# Patient Record
Sex: Male | Born: 1960 | Hispanic: No | Marital: Married | Smoking: Current every day smoker
Health system: Southern US, Community
[De-identification: ages and names within clinical notes are randomized; demographics above are authoritative.]

## PROBLEM LIST (undated history)

## (undated) DIAGNOSIS — M1990 Unspecified osteoarthritis, unspecified site: Secondary | ICD-10-CM

## (undated) DIAGNOSIS — E119 Type 2 diabetes mellitus without complications: Secondary | ICD-10-CM

## (undated) HISTORY — PX: TONSILLECTOMY: SUR1361

## (undated) HISTORY — PX: BACK SURGERY: SHX140

---

## 2005-02-18 ENCOUNTER — Ambulatory Visit (HOSPITAL_COMMUNITY): Admission: RE | Admit: 2005-02-18 | Discharge: 2005-02-19 | Payer: Self-pay | Admitting: Specialist

## 2019-07-06 ENCOUNTER — Encounter: Payer: Self-pay | Admitting: Internal Medicine

## 2021-01-23 ENCOUNTER — Ambulatory Visit: Payer: Self-pay | Admitting: Orthopedic Surgery

## 2021-01-27 NOTE — Pre-Procedure Instructions (Signed)
Surgical Instructions    Your procedure is scheduled on Thursday, January 30, 2021 from 10:15 AM - 12:15 PM.  Report to [facility] Main Entrance "A" at 8:15 A.M., then check in with the Admitting office.  Call this number if you have problems the morning of surgery:  [phone]   If you have any questions prior to your surgery date call [phone]: Open Monday-Friday 8am-4pm    Remember:  Do not eat after midnight the night before your surgery  You may drink clear liquids until 7:15 AM the morning of your surgery.   Clear liquids allowed are: Water, Non-Citrus Juices (without pulp), Carbonated Beverages, Clear Tea, Black Coffee Only, and Gatorade   Please complete your PRE-SURGERY ENSURE that was provided to you by 7:15 AM the morning of surgery.  Please, if able, drink it in one setting. DO NOT SIP.     Take these medicines the morning of surgery with A SIP OF WATER:   gabapentin (NEURONTIN) omeprazole (PRILOSEC)   AS NEEDED:  HYDROcodone-acetaminophen (NORCO)   Please bring albuterol (VENTOLIN HFA) with you the day of surgery.   As of today, STOP taking any Aspirin (unless otherwise instructed by your surgeon) Aleve, Naproxen, Ibuprofen, Motrin, Advil, Goody's, BC's, all herbal medications, fish oil, and all vitamins.  WHAT DO I DO ABOUT MY DIABETES MEDICATION?  Do not take your evening dose of glipiZIDE (GLUCOTROL) on 01/29/2021.  Do not take (metFORMIN (GLUCOPHAGE) or glipiZIDE (GLUCOTROL)) the morning of surgery.  The day of surgery, do not take Trulicity (dulaglutide).   HOW TO MANAGE YOUR DIABETES BEFORE AND AFTER SURGERY  Why is it important to control my blood sugar before and after surgery? Improving blood sugar levels before and after surgery helps healing and can limit problems. A way of improving blood sugar control is eating a healthy diet by:  Eating less sugar and carbohydrates  Increasing activity/exercise  Talking with your doctor about  reaching your blood sugar goals High blood sugars (greater than 180 mg/dL) can raise your risk of infections and slow your recovery, so you will need to focus on controlling your diabetes during the weeks before surgery. Make sure that the doctor who takes care of your diabetes knows about your planned surgery including the date and location.  How do I manage my blood sugar before surgery? Check your blood sugar at least 4 times a day, starting 2 days before surgery, to make sure that the level is not too high or low.  Check your blood sugar the morning of your surgery when you wake up and every 2 hours until you get to the Short Stay unit.  If your blood sugar is less than 70 mg/dL, you will need to treat for low blood sugar: Do not take insulin. Treat a low blood sugar (less than 70 mg/dL) with  cup of clear juice (cranberry or apple), 4 glucose tablets, OR glucose gel. Recheck blood sugar in 15 minutes after treatment (to make sure it is greater than 70 mg/dL). If your blood sugar is not greater than 70 mg/dL on recheck, call [phone] for further instructions. Report your blood sugar to the short stay nurse when you get to Short Stay.  If you are admitted to the hospital after surgery: Your blood sugar will be checked by the staff and you will probably be given insulin after surgery (instead of oral diabetes medicines) to make sure you have good blood sugar levels. The goal for blood sugar control after surgery  is 80-180 mg/dL.             Do NOT Smoke (Tobacco/Vaping) or drink Alcohol 24 hours prior to your procedure.  If you use a CPAP at night, you may bring all equipment for your overnight stay.   Contacts, glasses, piercing's, hearing aid's, dentures or partials may not be worn into surgery, please bring cases for these belongings.    For patients admitted to the hospital, discharge time will be determined by your treatment team.   Patients discharged the day of surgery will  not be allowed to drive home, and someone needs to stay with them for 24 hours.  ONLY 1 SUPPORT PERSON MAY BE PRESENT WHILE YOU ARE IN SURGERY. IF YOU ARE TO BE ADMITTED ONCE YOU ARE IN YOUR ROOM YOU WILL BE ALLOWED TWO (2) VISITORS.  Minor children may have two parents present. Special consideration for safety and communication needs will be reviewed on a case by case basis.   Special instructions:   [facility]- Preparing For Surgery  Before surgery, you can play an important role. Because skin is not sterile, your skin needs to be as free of germs as possible. You can reduce the number of germs on your skin by washing with CHG (chlorahexidine gluconate) Soap before surgery.  CHG is an antiseptic cleaner which kills germs and bonds with the skin to continue killing germs even after washing.    Oral Hygiene is also important to reduce your risk of infection.  Remember - BRUSH YOUR TEETH THE MORNING OF SURGERY WITH YOUR REGULAR TOOTHPASTE  Please do not use if you have an allergy to CHG or antibacterial soaps. If your skin becomes reddened/irritated stop using the CHG.  Do not shave (including legs and underarms) for at least 48 hours prior to first CHG shower. It is OK to shave your face.  Please follow these instructions carefully.   Shower the NIGHT BEFORE SURGERY and the MORNING OF SURGERY  If you chose to wash your hair, wash your hair first as usual with your normal shampoo.  After you shampoo, rinse your hair and body thoroughly to remove the shampoo.  Use CHG Soap as you would any other liquid soap. You can apply CHG directly to the skin and wash gently with a scrungie or a clean washcloth.   Apply the CHG Soap to your body ONLY FROM THE NECK DOWN.  Do not use on open wounds or open sores. Avoid contact with your eyes, ears, mouth and genitals (private parts). Wash Face and genitals (private parts)  with your normal soap.   Wash thoroughly, paying special attention to the area  where your surgery will be performed.  Thoroughly rinse your body with warm water from the neck down.  DO NOT shower/wash with your normal soap after using and rinsing off the CHG Soap.  Pat yourself dry with a CLEAN TOWEL.  Wear CLEAN PAJAMAS to bed the night before surgery  Place CLEAN SHEETS on your bed the night before your surgery  DO NOT SLEEP WITH PETS.   Day of Surgery: Shower with CHG soap. Do not wear jewelry. Do not wear lotions, powders, colognes, or deodorant. Men may shave face and neck. Do not bring valuables to the hospital. [facility] is not responsible for any belongings or valuables. Wear Clean/Comfortable clothing the morning of surgery Remember to brush your teeth WITH YOUR REGULAR TOOTHPASTE.   Please read over the following fact sheets that you were given.

## 2021-01-28 ENCOUNTER — Encounter (HOSPITAL_COMMUNITY)
Admission: RE | Admit: 2021-01-28 | Discharge: 2021-01-28 | Disposition: A | Discharge status: Home / Self Care | Payer: BC Managed Care – PPO | Source: Ambulatory Visit | Attending: Specialist | Admitting: Specialist

## 2021-01-28 ENCOUNTER — Other Ambulatory Visit: Payer: Self-pay

## 2021-01-28 ENCOUNTER — Encounter (HOSPITAL_COMMUNITY): Payer: Self-pay

## 2021-01-28 ENCOUNTER — Ambulatory Visit (HOSPITAL_COMMUNITY)
Admission: RE | Admit: 2021-01-28 | Discharge: 2021-01-28 | Disposition: A | Discharge status: Home / Self Care | Payer: BC Managed Care – PPO | Source: Ambulatory Visit | Attending: Orthopedic Surgery | Admitting: Orthopedic Surgery

## 2021-01-28 DIAGNOSIS — M5126 Other intervertebral disc displacement, lumbar region: Secondary | ICD-10-CM

## 2021-01-28 DIAGNOSIS — Z01818 Encounter for other preprocedural examination: Secondary | ICD-10-CM | POA: Diagnosis not present

## 2021-01-28 DIAGNOSIS — M5136 Other intervertebral disc degeneration, lumbar region: Secondary | ICD-10-CM | POA: Insufficient documentation

## 2021-01-28 DIAGNOSIS — R29890 Loss of height: Secondary | ICD-10-CM | POA: Diagnosis not present

## 2021-01-28 DIAGNOSIS — Z20822 Contact with and (suspected) exposure to covid-19: Secondary | ICD-10-CM | POA: Diagnosis not present

## 2021-01-28 HISTORY — DX: Type 2 diabetes mellitus without complications: E11.9

## 2021-01-28 HISTORY — DX: Unspecified osteoarthritis, unspecified site: M19.90

## 2021-01-28 LAB — BASIC METABOLIC PANEL
Anion gap: 10 (ref 5–15)
BUN: 14 mg/dL (ref 8–23)
CO2: 24 mmol/L (ref 22–32)
Calcium: 10 mg/dL (ref 8.9–10.3)
Chloride: 102 mmol/L (ref 98–111)
Creatinine, Ser: 0.91 mg/dL (ref 0.61–1.24)
GFR, Estimated: >60 mL/min (ref >60)
Glucose, Bld: 198 mg/dL — ABNORMAL HIGH (ref 70–99)
Potassium: 3.5 mmol/L (ref 3.5–5.1)
Sodium: 136 mmol/L (ref 135–145)

## 2021-01-28 LAB — GLUCOSE, CAPILLARY: Glucose-Capillary: 189 mg/dL — ABNORMAL HIGH (ref 70–99)

## 2021-01-28 LAB — SURGICAL PCR SCREEN
MRSA, PCR: NEGATIVE
Staphylococcus aureus: POSITIVE — AB

## 2021-01-28 LAB — CBC
HCT: 44.4 % (ref 39.0–52.0)
Hemoglobin: 15.4 g/dL (ref 13.0–17.0)
MCH: 33 pg (ref 26.0–34.0)
MCHC: 34.7 g/dL (ref 30.0–36.0)
MCV: 95.3 fL (ref 80.0–100.0)
Platelets: 269 10*3/uL (ref 150–400)
RBC: 4.66 MIL/uL (ref 4.22–5.81)
RDW: 11.9 % (ref 11.5–15.5)
WBC: 14.6 10*3/uL — ABNORMAL HIGH (ref 4.0–10.5)
nRBC: 0 % (ref 0.0–0.2)

## 2021-01-28 LAB — SARS CORONAVIRUS 2 (TAT 6-24 HRS): SARS Coronavirus 2: NEGATIVE

## 2021-01-28 NOTE — Progress Notes (Addendum)
PCP - Foye Deer [facility]) Cardiologist - denies  Chest x-ray - (Lumbar Spine 2-3 views) -01/28/21 EKG - 01/28/21  DM - Type 2 Fasting Blood Sugar - 150-180  ERAS Protcol -  yes, water ordered & given  PRE-SURGERY Ensure or G2-   COVID TEST- 01/28/21   Anesthesia review: yes, EKG  Patient denies shortness of breath, fever, cough and chest pain at PAT appointment   All instructions explained to the patient, with a verbal understanding of the material. Patient agrees to go over the instructions while at home for a better understanding. Patient also instructed to self quarantine after being tested for COVID-19. The opportunity to ask questions was provided.

## 2021-01-29 ENCOUNTER — Ambulatory Visit: Payer: Self-pay | Admitting: Orthopedic Surgery

## 2021-01-29 LAB — HEMOGLOBIN A1C
Hgb A1c MFr Bld: 7.2 % — ABNORMAL HIGH (ref 4.8–5.6)
Mean Plasma Glucose: 160 mg/dL

## 2021-01-29 MED ORDER — VANCOMYCIN HCL 10 G IV SOLR: 1000.0000 mg | INTRAVENOUS | Status: AC

## 2021-01-29 NOTE — H&P (View-Only) (Signed)
Jacob Lucas is an 60 y.o. male.   Chief Complaint: back and leg pain right HPI: Reason for Visit: low back Context: The patient is 7 1/2 months out from onset. Location (Lower Extremity): lower back pain ; ankle pain on the right Severity: pain level 8/10 Timing: constant Associated Symptoms: numbness/tingling Medications: The patient is taking gabapentin 300mg  2 tabs TID Notes: The patient is 4 weeks following ESI right L4-5. He reports the injection helped very little  Past Medical History:  Diagnosis Date   Arthritis    Diabetes mellitus without complication (HCC)     Past Surgical History:  Procedure Laterality Date   BACK SURGERY     TONSILLECTOMY      No family history on file. Social History:  reports that he has been smoking cigarettes. He has been smoking an average of 1 pack per day. He has never used smokeless tobacco. He reports that he does not drink alcohol and does not use drugs.  Allergies: No Known Allergies  Medications: albuterol sulfate HFA 90 mcg/actuation aerosol inhaler atorvastatin 20 mg tablet glipiZIDE 10 mg tablet ibuprofen metFORMIN naproxen 500 mg tablet Neurontin 300 mg capsule nystatin-triamcinolone 100,000 unit/g-0.1 % topical cream omeprazole 20 mg capsule,delayed release oxyCODONE-acetaminophen 5 mg-325 mg tablet sildenafiL 100 mg tablet   Results for orders placed or performed during the hospital encounter of 01/28/21 (from the past 48 hour(s))  Glucose, capillary     Status: Abnormal   Collection Time: 01/28/21 12:56 PM  Result Value Ref Range   Glucose-Capillary 189 (H) 70 - 99 mg/dL    Comment: Glucose reference range applies only to samples taken after fasting for at least 8 hours.  SARS CORONAVIRUS 2 (TAT 6-24 HRS) Nasopharyngeal Nasopharyngeal Swab     Status: None   Collection Time: 01/28/21  1:15 PM   Specimen: Nasopharyngeal Swab  Result Value Ref Range   SARS Coronavirus 2 NEGATIVE NEGATIVE    Comment:  (NOTE) SARS-CoV-2 target nucleic acids are NOT DETECTED.  The SARS-CoV-2 RNA is generally detectable in upper and lower respiratory specimens during the acute phase of infection. Negative results do not preclude SARS-CoV-2 infection, do not rule out co-infections with other pathogens, and should not be used as the sole basis for treatment or other patient management decisions. Negative results must be combined with clinical observations, patient history, and epidemiological information. The expected result is Negative.  Fact Sheet for Patients: 01/30/21  Fact Sheet for Healthcare Providers: HairSlick.no  This test is not yet approved or cleared by the quierodirigir.com FDA and  has been authorized for detection and/or diagnosis of SARS-CoV-2 by FDA under an Emergency Use Authorization (EUA). This EUA will remain  in effect (meaning this test can be used) for the duration of the COVID-19 declaration under Se ction 564(b)(1) of the Act, 21 U.S.C. section 360bbb-3(b)(1), unless the authorization is terminated or revoked sooner.  Performed at [facility], [address] [place], [place]   CBC     Status: Abnormal   Collection Time: 01/28/21  1:30 PM  Result Value Ref Range   WBC 14.6 (H) 4.0 - 10.5 K/uL   RBC 4.66 4.22 - 5.81 MIL/uL   Hemoglobin 15.4 13.0 - 17.0 g/dL   HCT 01/30/21 22.9 - 79.8 %   MCV 95.3 80.0 - 100.0 fL   MCH 33.0 26.0 - 34.0 pg   MCHC 34.7 30.0 - 36.0 g/dL   RDW 92.1 19.4 - 17.4 %   Platelets  269 150 - 400 K/uL   nRBC 0.0 0.0 - 0.2 %    Comment: Performed at [facility], [address] [place], [place] [address]  Basic metabolic panel     Status: Abnormal   Collection Time: 01/28/21  1:30 PM  Result Value Ref Range   Sodium 136 135 - 145 mmol/L   Potassium 3.5 3.5 - 5.1 mmol/L   Chloride 102 98 - 111 mmol/L   CO2 24 22 - 32 mmol/L   Glucose, Bld 198 (H) 70 - 99 mg/dL     Comment: Glucose reference range applies only to samples taken after fasting for at least 8 hours.   BUN 14 8 - 23 mg/dL   Creatinine, Ser 1.910.91 0.61 - 1.24 mg/dL   Calcium 47.810.0 8.9 - 29.510.3 mg/dL   GFR, Estimated >62>60 >13>60 mL/min    Comment: (NOTE) Calculated using the CKD-EPI Creatinine Equation (2021)    Anion gap 10 5 - 15    Comment: Performed at [facility], [address] [place], [place] [address]  Hemoglobin A1c per protocol     Status: Abnormal   Collection Time: 01/28/21  2:00 PM  Result Value Ref Range   Hgb A1c MFr Bld 7.2 (H) 4.8 - 5.6 %    Comment: (NOTE)         Prediabetes: 5.7 - 6.4         Diabetes: >6.4         Glycemic control for adults with diabetes: <7.0    Mean Plasma Glucose 160 mg/dL    Comment: (NOTE) Performed At: [facility] [address] [place], [place] [id number] Jolene SchimkeNagendra Sanjai MD WU:[id number]:[phone]   Surgical pcr screen     Status: Abnormal   Collection Time: 01/28/21  2:47 PM   Specimen: Nasal Mucosa; Nasal Swab  Result Value Ref Range   MRSA, PCR NEGATIVE NEGATIVE   Staphylococcus aureus POSITIVE (A) NEGATIVE    Comment: (NOTE) The Xpert SA Assay (FDA approved for NASAL specimens in patients 61 years of age and older), is one component of a comprehensive surveillance program. It is not intended to diagnose infection nor to guide or monitor treatment. Performed at [facility], [address] [place], [place] [address]    No results found.  Review of Systems  Constitutional: Negative.   HENT: Negative.    Eyes: Negative.   Respiratory: Negative.    Cardiovascular: Negative.   Gastrointestinal: Negative.   Genitourinary: Negative.   Musculoskeletal:  Positive for back pain and myalgias.  Skin: Negative.   Neurological:  Positive for weakness and numbness.   There were no vitals taken for this visit. Physical Exam Constitutional:      Appearance: Normal appearance.  HENT:     Head: Normocephalic and atraumatic.      Right Ear: External ear normal.     Left Ear: External ear normal.     Nose: Nose normal.     Mouth/Throat:     Pharynx: Oropharynx is clear.  Eyes:     Conjunctiva/sclera: Conjunctivae normal.  Cardiovascular:     Rate and Rhythm: Normal rate and regular rhythm.     Pulses: Normal pulses.  Pulmonary:     Effort: Pulmonary effort is normal.     Breath sounds: Normal breath sounds.  Abdominal:     General: Bowel sounds are normal.  Musculoskeletal:     Cervical back: Normal range of motion.     Comments:  Gait and Station: Appearance: ambulating with no assistive devices and antalgic gait.  Constitutional: General Appearance: healthy-appearing and distress (mild).  Psychiatric: Mood and Affect: active and alert.  Cardiovascular System: Edema Right: none; Dorsalis and posterior tibial pulses 2+. Edema Left: none.  Abdomen: Inspection and Palpation: non-distended and no tenderness.  Skin: Inspection and palpation: no rash.  Lumbar Spine: Inspection: normal alignment. Bony Palpation of the Lumbar Spine: tender at lumbosacral junction.. Bony Palpation of the Right Hip: no tenderness of the greater trochanter and tenderness of the SI joint; Pelvis stable. Bony Palpation of the Left Hip: no tenderness of the greater trochanter and tenderness of the SI joint. Soft Tissue Palpation on the Right: No flank pain with percussion. Active Range of Motion: limited flexion and extention.  Motor Strength: L1 Motor Strength on the Right: hip flexion iliopsoas 5/5. L1 Motor Strength on the Left: hip flexion iliopsoas 5/5. L2-L4 Motor Strength on the Right: knee extension quadriceps 5/5. L2-L4 Motor Strength on the Left: knee extension quadriceps 5/5. L5 Motor Strength on the Right: ankle dorsiflexion tibialis anterior 5/5 and great toe extension extensor hallucis longus 4/5. L5 Motor Strength on the Left: ankle dorsiflexion tibialis anterior 5/5 and great toe extension extensor hallucis longus 5/5. S1  Motor Strength on the Right: plantar flexion gastrocnemius 5/5. S1 Motor Strength on the Left: plantar flexion gastrocnemius 5/5.  Neurological System: Knee Reflex Right: normal (2). Knee Reflex Left: normal (2). Ankle Reflex Right: normal (2). Ankle Reflex Left: normal (2). Babinski Reflex Right: plantar reflex absent. Babinski Reflex Left: plantar reflex absent. Sensation on the Right: normal distal extremities. Sensation on the Left: normal distal extremities. Special Tests on the Right: no clonus of the ankle/knee and seated straight leg raising test positive. Special Tests on the Left: no clonus of the ankle/knee and seated straight leg raising test positive.  Skin:    General: Skin is warm and dry.  Neurological:     Mental Status: He is alert.    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 A small right-sided disc herniation with caudal migration severe right lateral recess stenosis with medial displacement of the right L5 nerve roots. Moderate spinal stenosis. Foraminal narrowing on the left possibly impinging on the L4 nerve roots. Degenerative listhesis of L4 on L5 with facet arthrosis  X-rays previously demonstrated no spondylolisthesis at L4-5 or instability with flexion-extension. Disc degeneration L2-3 and L3-4.  Assessment/Plan Impression:  Right lower extremity radicular pain secondary to disc herniation and spinal stenosis at L4-5 with severe lateral recess stenosis on the right. Neuroforaminal narrowing. No instability in flexion extension on radiographs. Neurotension signs and dorsiflexion weakness  History lumbar decompression L5-S1.  Plan:  We discussed options. Certainly reasonable to attempt an epidural steroid injection combined with physical therapy. I discussed proceeding with an epidural steroid injection at the level of the pathology and nerve root irritation. The Patient is in agreement.  L4-5. The procedure is performed under x-ray guidance. This is performed by Dr. Ethelene Hal in our facility or with a Radiologist in town. This will be scheduled after authorization has been secured from your insurance company. Hopefully the injection will be therapeutic however every situation is different. If the relief is only temporarily effective we discussed a repeat injection versus consideration of a surgical procedure performed at that level. It is important to maintain a pain journal to recall the effects of the injection even if limited. If complete relief of symptoms are  obtained then follow-up is optional. If only temporary relief of your symptoms are obtained then I would recommend a follow-up consultation for further discussion. Monitor your blood glucose levels and supplement accordingly following the injection if you are a diabetic.  If that is not efficacious we discussed microlumbar decompression L4-5. This addressing the leg pain only as he has multiple levels of disc degeneration. Which would require a fusion at multiple levels.  Continue taking his gabapentin. Refill was given.  Continue taking his anti-inflammatory. He should only take 1 either the ibuprofen or the naproxen. He discontinued the naproxen.  If there is any increase in his weakness in the right lower extremity he is to call.  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  Plan microlumbar decompression, discectomy L4-5  Dorothy Spark, PA-C for Dr Shelle Iron 01/29/2021, 2:16 PM

## 2021-01-29 NOTE — H&P (Signed)
Jacob Lucas is an 60 y.o. male.   Chief Complaint: back and leg pain right HPI: Reason for Visit: low back Context: The patient is 7 1/2 months out from onset. Location (Lower Extremity): lower back pain ; ankle pain on the right Severity: pain level 8/10 Timing: constant Associated Symptoms: numbness/tingling Medications: The patient is taking gabapentin 300mg  2 tabs TID Notes: The patient is 4 weeks following ESI right L4-5. He reports the injection helped very little  Past Medical History:  Diagnosis Date   Arthritis    Diabetes mellitus without complication (HCC)     Past Surgical History:  Procedure Laterality Date   BACK SURGERY     TONSILLECTOMY      No family history on file. Social History:  reports that he has been smoking cigarettes. He has been smoking an average of 1 pack per day. He has never used smokeless tobacco. He reports that he does not drink alcohol and does not use drugs.  Allergies: No Known Allergies  Medications: albuterol sulfate HFA 90 mcg/actuation aerosol inhaler atorvastatin 20 mg tablet glipiZIDE 10 mg tablet ibuprofen metFORMIN naproxen 500 mg tablet Neurontin 300 mg capsule nystatin-triamcinolone 100,000 unit/g-0.1 % topical cream omeprazole 20 mg capsule,delayed release oxyCODONE-acetaminophen 5 mg-325 mg tablet sildenafiL 100 mg tablet   Results for orders placed or performed during the hospital encounter of 01/28/21 (from the past 48 hour(s))  Glucose, capillary     Status: Abnormal   Collection Time: 01/28/21 12:56 PM  Result Value Ref Range   Glucose-Capillary 189 (H) 70 - 99 mg/dL    Comment: Glucose reference range applies only to samples taken after fasting for at least 8 hours.  SARS CORONAVIRUS 2 (TAT 6-24 HRS) Nasopharyngeal Nasopharyngeal Swab     Status: None   Collection Time: 01/28/21  1:15 PM   Specimen: Nasopharyngeal Swab  Result Value Ref Range   SARS Coronavirus 2 NEGATIVE NEGATIVE    Comment:  (NOTE) SARS-CoV-2 target nucleic acids are NOT DETECTED.  The SARS-CoV-2 RNA is generally detectable in upper and lower respiratory specimens during the acute phase of infection. Negative results do not preclude SARS-CoV-2 infection, do not rule out co-infections with other pathogens, and should not be used as the sole basis for treatment or other patient management decisions. Negative results must be combined with clinical observations, patient history, and epidemiological information. The expected result is Negative.  Fact Sheet for Patients: 01/30/21  Fact Sheet for Healthcare Providers: HairSlick.no  This test is not yet approved or cleared by the quierodirigir.com FDA and  has been authorized for detection and/or diagnosis of SARS-CoV-2 by FDA under an Emergency Use Authorization (EUA). This EUA will remain  in effect (meaning this test can be used) for the duration of the COVID-19 declaration under Se ction 564(b)(1) of the Act, 21 U.S.C. section 360bbb-3(b)(1), unless the authorization is terminated or revoked sooner.  Performed at [facility], [address] [place], [place]   CBC     Status: Abnormal   Collection Time: 01/28/21  1:30 PM  Result Value Ref Range   WBC 14.6 (H) 4.0 - 10.5 K/uL   RBC 4.66 4.22 - 5.81 MIL/uL   Hemoglobin 15.4 13.0 - 17.0 g/dL   HCT 01/30/21 22.9 - 79.8 %   MCV 95.3 80.0 - 100.0 fL   MCH 33.0 26.0 - 34.0 pg   MCHC 34.7 30.0 - 36.0 g/dL   RDW 92.1 19.4 - 17.4 %   Platelets  269 150 - 400 K/uL   nRBC 0.0 0.0 - 0.2 %    Comment: Performed at [facility], [address] [place], [place] [address]  Basic metabolic panel     Status: Abnormal   Collection Time: 01/28/21  1:30 PM  Result Value Ref Range   Sodium 136 135 - 145 mmol/L   Potassium 3.5 3.5 - 5.1 mmol/L   Chloride 102 98 - 111 mmol/L   CO2 24 22 - 32 mmol/L   Glucose, Bld 198 (H) 70 - 99 mg/dL     Comment: Glucose reference range applies only to samples taken after fasting for at least 8 hours.   BUN 14 8 - 23 mg/dL   Creatinine, Ser 0.91 0.61 - 1.24 mg/dL   Calcium 10.0 8.9 - 10.3 mg/dL   GFR, Estimated >60 >60 mL/min    Comment: (NOTE) Calculated using the CKD-EPI Creatinine Equation (2021)    Anion gap 10 5 - 15    Comment: Performed at [facility], [address] [place], [place] [address]  Hemoglobin A1c per protocol     Status: Abnormal   Collection Time: 01/28/21  2:00 PM  Result Value Ref Range   Hgb A1c MFr Bld 7.2 (H) 4.8 - 5.6 %    Comment: (NOTE)         Prediabetes: 5.7 - 6.4         Diabetes: >6.4         Glycemic control for adults with diabetes: <7.0    Mean Plasma Glucose 160 mg/dL    Comment: (NOTE) Performed At: [facility] [address] [place], [place] [id number] Nagendra Sanjai MD Ph:[phone]   Surgical pcr screen     Status: Abnormal   Collection Time: 01/28/21  2:47 PM   Specimen: Nasal Mucosa; Nasal Swab  Result Value Ref Range   MRSA, PCR NEGATIVE NEGATIVE   Staphylococcus aureus POSITIVE (A) NEGATIVE    Comment: (NOTE) The Xpert SA Assay (FDA approved for NASAL specimens in patients 22 years of age and older), is one component of a comprehensive surveillance program. It is not intended to diagnose infection nor to guide or monitor treatment. Performed at [facility], [address] [place], [place] [address]    No results found.  Review of Systems  Constitutional: Negative.   HENT: Negative.    Eyes: Negative.   Respiratory: Negative.    Cardiovascular: Negative.   Gastrointestinal: Negative.   Genitourinary: Negative.   Musculoskeletal:  Positive for back pain and myalgias.  Skin: Negative.   Neurological:  Positive for weakness and numbness.   There were no vitals taken for this visit. Physical Exam Constitutional:      Appearance: Normal appearance.  HENT:     Head: Normocephalic and atraumatic.      Right Ear: External ear normal.     Left Ear: External ear normal.     Nose: Nose normal.     Mouth/Throat:     Pharynx: Oropharynx is clear.  Eyes:     Conjunctiva/sclera: Conjunctivae normal.  Cardiovascular:     Rate and Rhythm: Normal rate and regular rhythm.     Pulses: Normal pulses.  Pulmonary:     Effort: Pulmonary effort is normal.     Breath sounds: Normal breath sounds.  Abdominal:     General: Bowel sounds are normal.  Musculoskeletal:     Cervical back: Normal range of motion.     Comments:   Gait and Station: Appearance: ambulating with no assistive devices and antalgic gait.  Constitutional: General Appearance: healthy-appearing and distress (mild).  Psychiatric: Mood and Affect: active and alert.  Cardiovascular System: Edema Right: none; Dorsalis and posterior tibial pulses 2+. Edema Left: none.  Abdomen: Inspection and Palpation: non-distended and no tenderness.  Skin: Inspection and palpation: no rash.  Lumbar Spine: Inspection: normal alignment. Bony Palpation of the Lumbar Spine: tender at lumbosacral junction.. Bony Palpation of the Right Hip: no tenderness of the greater trochanter and tenderness of the SI joint; Pelvis stable. Bony Palpation of the Left Hip: no tenderness of the greater trochanter and tenderness of the SI joint. Soft Tissue Palpation on the Right: No flank pain with percussion. Active Range of Motion: limited flexion and extention.  Motor Strength: L1 Motor Strength on the Right: hip flexion iliopsoas 5/5. L1 Motor Strength on the Left: hip flexion iliopsoas 5/5. L2-L4 Motor Strength on the Right: knee extension quadriceps 5/5. L2-L4 Motor Strength on the Left: knee extension quadriceps 5/5. L5 Motor Strength on the Right: ankle dorsiflexion tibialis anterior 5/5 and great toe extension extensor hallucis longus 4/5. L5 Motor Strength on the Left: ankle dorsiflexion tibialis anterior 5/5 and great toe extension extensor hallucis longus 5/5. S1  Motor Strength on the Right: plantar flexion gastrocnemius 5/5. S1 Motor Strength on the Left: plantar flexion gastrocnemius 5/5.  Neurological System: Knee Reflex Right: normal (2). Knee Reflex Left: normal (2). Ankle Reflex Right: normal (2). Ankle Reflex Left: normal (2). Babinski Reflex Right: plantar reflex absent. Babinski Reflex Left: plantar reflex absent. Sensation on the Right: normal distal extremities. Sensation on the Left: normal distal extremities. Special Tests on the Right: no clonus of the ankle/knee and seated straight leg raising test positive. Special Tests on the Left: no clonus of the ankle/knee and seated straight leg raising test positive.  Skin:    General: Skin is warm and dry.  Neurological:     Mental Status: He is alert.    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 A small right-sided disc herniation with caudal migration severe right lateral recess stenosis with medial displacement of the right L5 nerve roots. Moderate spinal stenosis. Foraminal narrowing on the left possibly impinging on the L4 nerve roots. Degenerative listhesis of L4 on L5 with facet arthrosis  X-rays previously demonstrated no spondylolisthesis at L4-5 or instability with flexion-extension. Disc degeneration L2-3 and L3-4.  Assessment/Plan Impression:  Right lower extremity radicular pain secondary to disc herniation and spinal stenosis at L4-5 with severe lateral recess stenosis on the right. Neuroforaminal narrowing. No instability in flexion extension on radiographs. Neurotension signs and dorsiflexion weakness  History lumbar decompression L5-S1.  Plan:  We discussed options. Certainly reasonable to attempt an epidural steroid injection combined with physical therapy. I discussed proceeding with an epidural steroid injection at the level of the pathology and nerve root irritation. The Patient is in agreement.  L4-5. The procedure is performed under x-ray guidance. This is performed by Dr. Ethelene Hal in our facility or with a Radiologist in town. This will be scheduled after authorization has been secured from your insurance company. Hopefully the injection will be therapeutic however every situation is different. If the relief is only temporarily effective we discussed a repeat injection versus consideration of a surgical procedure performed at that level. It is important to maintain a pain journal to recall the effects of the injection even if limited. If complete relief of symptoms are  obtained then follow-up is optional. If only temporary relief of your symptoms are obtained then I would recommend a follow-up consultation for further discussion. Monitor your blood glucose levels and supplement accordingly following the injection if you are a diabetic.  If that is not efficacious we discussed microlumbar decompression L4-5. This addressing the leg pain only as he has multiple levels of disc degeneration. Which would require a fusion at multiple levels.  Continue taking his gabapentin. Refill was given.  Continue taking his anti-inflammatory. He should only take 1 either the ibuprofen or the naproxen. He discontinued the naproxen.  If there is any increase in his weakness in the right lower extremity he is to call.  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  Plan microlumbar decompression, discectomy L4-5  Dorothy Spark, PA-C for Dr Shelle Iron 01/29/2021, 2:16 PM

## 2021-01-30 ENCOUNTER — Encounter (HOSPITAL_COMMUNITY): Payer: Self-pay | Admitting: Specialist

## 2021-01-30 ENCOUNTER — Ambulatory Visit (HOSPITAL_COMMUNITY): Payer: BC Managed Care – PPO | Admitting: Physician Assistant

## 2021-01-30 ENCOUNTER — Ambulatory Visit (HOSPITAL_COMMUNITY): Payer: BC Managed Care – PPO | Admitting: Anesthesiology

## 2021-01-30 ENCOUNTER — Ambulatory Visit (HOSPITAL_COMMUNITY): Payer: BC Managed Care – PPO

## 2021-01-30 ENCOUNTER — Ambulatory Visit (HOSPITAL_COMMUNITY)
Admission: RE | Admit: 2021-01-30 | Discharge: 2021-01-30 | Disposition: A | Discharge status: Home / Self Care | Payer: BC Managed Care – PPO | Attending: Specialist | Admitting: Specialist

## 2021-01-30 ENCOUNTER — Other Ambulatory Visit: Payer: Self-pay

## 2021-01-30 ENCOUNTER — Encounter (HOSPITAL_COMMUNITY)
Admission: RE | Disposition: A | Discharge status: Home / Self Care | Payer: Self-pay | Source: Home / Self Care | Attending: Specialist

## 2021-01-30 DIAGNOSIS — M2578 Osteophyte, vertebrae: Secondary | ICD-10-CM | POA: Insufficient documentation

## 2021-01-30 DIAGNOSIS — Z7984 Long term (current) use of oral hypoglycemic drugs: Secondary | ICD-10-CM | POA: Diagnosis not present

## 2021-01-30 DIAGNOSIS — Z79899 Other long term (current) drug therapy: Secondary | ICD-10-CM | POA: Diagnosis not present

## 2021-01-30 DIAGNOSIS — M48061 Spinal stenosis, lumbar region without neurogenic claudication: Secondary | ICD-10-CM | POA: Diagnosis present

## 2021-01-30 DIAGNOSIS — Z79891 Long term (current) use of opiate analgesic: Secondary | ICD-10-CM | POA: Insufficient documentation

## 2021-01-30 DIAGNOSIS — Z791 Long term (current) use of non-steroidal anti-inflammatories (NSAID): Secondary | ICD-10-CM | POA: Diagnosis not present

## 2021-01-30 DIAGNOSIS — M7138 Other bursal cyst, other site: Secondary | ICD-10-CM | POA: Insufficient documentation

## 2021-01-30 DIAGNOSIS — M5116 Intervertebral disc disorders with radiculopathy, lumbar region: Secondary | ICD-10-CM | POA: Diagnosis not present

## 2021-01-30 DIAGNOSIS — F17210 Nicotine dependence, cigarettes, uncomplicated: Secondary | ICD-10-CM | POA: Diagnosis not present

## 2021-01-30 DIAGNOSIS — Z419 Encounter for procedure for purposes other than remedying health state, unspecified: Secondary | ICD-10-CM

## 2021-01-30 HISTORY — PX: LUMBAR LAMINECTOMY/DECOMPRESSION MICRODISCECTOMY: SHX5026

## 2021-01-30 LAB — GLUCOSE, CAPILLARY
Glucose-Capillary: 130 mg/dL — ABNORMAL HIGH (ref 70–99)
Glucose-Capillary: 111 mg/dL — ABNORMAL HIGH (ref 70–99)
Glucose-Capillary: 139 mg/dL — ABNORMAL HIGH (ref 70–99)
Glucose-Capillary: 248 mg/dL — ABNORMAL HIGH (ref 70–99)

## 2021-01-30 SURGERY — LUMBAR LAMINECTOMY/DECOMPRESSION MICRODISCECTOMY 1 LEVEL
Anesthesia: General

## 2021-01-30 MED ORDER — LACTATED RINGERS IV SOLN: INTRAVENOUS | Status: DC

## 2021-01-30 MED ORDER — LIDOCAINE 2% (20 MG/ML) 5 ML SYRINGE
INTRAMUSCULAR | Status: DC | PRN
  Administered 2021-01-30: 80 mg via INTRAVENOUS

## 2021-01-30 MED ORDER — PHENYLEPHRINE 40 MCG/ML (10ML) SYRINGE FOR IV PUSH (FOR BLOOD PRESSURE SUPPORT)
PREFILLED_SYRINGE | INTRAVENOUS | Status: AC
  Filled 2021-01-30: qty 10

## 2021-01-30 MED ORDER — METHOCARBAMOL 1000 MG/10ML IJ SOLN
500.0000 mg | Freq: Four times a day (QID) | INTRAVENOUS | Status: DC | PRN
  Filled 2021-01-30: qty 5

## 2021-01-30 MED ORDER — POTASSIUM CHLORIDE IN NACL 20-0.45 MEQ/L-% IV SOLN
INTRAVENOUS | Status: DC
  Filled 2021-01-30: qty 1000

## 2021-01-30 MED ORDER — POLYETHYLENE GLYCOL 3350 17 G PO PACK: 17.0000 g | PACK | Freq: Every day | ORAL | 0 refills | Status: AC

## 2021-01-30 MED ORDER — ALUM & MAG HYDROXIDE-SIMETH 200-200-20 MG/5ML PO SUSP: 30.0000 mL | Freq: Four times a day (QID) | ORAL | Status: DC | PRN

## 2021-01-30 MED ORDER — FENTANYL CITRATE (PF) 250 MCG/5ML IJ SOLN
INTRAMUSCULAR | Status: DC | PRN
  Administered 2021-01-30: 100 ug via INTRAVENOUS
  Administered 2021-01-30 (×2): 50 ug via INTRAVENOUS

## 2021-01-30 MED ORDER — FENTANYL CITRATE (PF) 250 MCG/5ML IJ SOLN
INTRAMUSCULAR | Status: AC
  Filled 2021-01-30: qty 5

## 2021-01-30 MED ORDER — CEFAZOLIN SODIUM-DEXTROSE 2-4 GM/100ML-% IV SOLN
2.0000 g | INTRAVENOUS | Status: AC
  Administered 2021-01-30: 2 g via INTRAVENOUS
  Filled 2021-01-30: qty 100

## 2021-01-30 MED ORDER — METHOCARBAMOL 500 MG PO TABS: 500.0000 mg | ORAL_TABLET | Freq: Three times a day (TID) | ORAL | 1 refills | Status: AC | PRN

## 2021-01-30 MED ORDER — THROMBIN 20000 UNITS EX SOLR
CUTANEOUS | Status: AC
  Filled 2021-01-30: qty 20000

## 2021-01-30 MED ORDER — DOCUSATE SODIUM 100 MG PO CAPS: 100.0000 mg | ORAL_CAPSULE | Freq: Two times a day (BID) | ORAL | 1 refills | Status: AC | PRN

## 2021-01-30 MED ORDER — CHLORHEXIDINE GLUCONATE 0.12 % MT SOLN
15.0000 mL | Freq: Once | OROMUCOSAL | Status: AC
  Administered 2021-01-30: 15 mL via OROMUCOSAL
  Filled 2021-01-30: qty 15

## 2021-01-30 MED ORDER — POLYETHYLENE GLYCOL 3350 17 G PO PACK: 17.0000 g | PACK | Freq: Every day | ORAL | Status: DC | PRN

## 2021-01-30 MED ORDER — OXYCODONE HCL 5 MG PO TABS: 5.0000 mg | ORAL_TABLET | ORAL | Status: DC | PRN

## 2021-01-30 MED ORDER — GABAPENTIN 300 MG PO CAPS
300.0000 mg | ORAL_CAPSULE | Freq: Three times a day (TID) | ORAL | Status: DC
  Administered 2021-01-30: 300 mg via ORAL
  Filled 2021-01-30: qty 1

## 2021-01-30 MED ORDER — DEXAMETHASONE SODIUM PHOSPHATE 10 MG/ML IJ SOLN
INTRAMUSCULAR | Status: AC
  Filled 2021-01-30: qty 1

## 2021-01-30 MED ORDER — MIDAZOLAM HCL 2 MG/2ML IJ SOLN
INTRAMUSCULAR | Status: AC
  Filled 2021-01-30: qty 2

## 2021-01-30 MED ORDER — INSULIN ASPART 100 UNIT/ML IJ SOLN: 0.0000 [IU] | Freq: Three times a day (TID) | INTRAMUSCULAR | Status: DC

## 2021-01-30 MED ORDER — METHOCARBAMOL 500 MG PO TABS: 500.0000 mg | ORAL_TABLET | Freq: Four times a day (QID) | ORAL | Status: DC | PRN

## 2021-01-30 MED ORDER — SUGAMMADEX SODIUM 200 MG/2ML IV SOLN
INTRAVENOUS | Status: DC | PRN
  Administered 2021-01-30: 150 mg via INTRAVENOUS

## 2021-01-30 MED ORDER — ACETAMINOPHEN 10 MG/ML IV SOLN
1000.0000 mg | INTRAVENOUS | Status: AC
  Administered 2021-01-30: 1000 mg via INTRAVENOUS
  Filled 2021-01-30: qty 100

## 2021-01-30 MED ORDER — HYDROMORPHONE HCL 1 MG/ML IJ SOLN: 1.0000 mg | INTRAMUSCULAR | Status: DC | PRN

## 2021-01-30 MED ORDER — PROPOFOL 10 MG/ML IV BOLUS
INTRAVENOUS | Status: DC | PRN
  Administered 2021-01-30: 150 mg via INTRAVENOUS

## 2021-01-30 MED ORDER — SUGAMMADEX SODIUM 500 MG/5ML IV SOLN
INTRAVENOUS | Status: AC
  Filled 2021-01-30: qty 5

## 2021-01-30 MED ORDER — PROPOFOL 10 MG/ML IV BOLUS
INTRAVENOUS | Status: AC
  Filled 2021-01-30: qty 20

## 2021-01-30 MED ORDER — PANTOPRAZOLE SODIUM 40 MG PO TBEC
40.0000 mg | DELAYED_RELEASE_TABLET | Freq: Every day | ORAL | Status: DC
  Administered 2021-01-30: 40 mg via ORAL
  Filled 2021-01-30: qty 1

## 2021-01-30 MED ORDER — DEXAMETHASONE SODIUM PHOSPHATE 10 MG/ML IJ SOLN
INTRAMUSCULAR | Status: DC | PRN
  Administered 2021-01-30: 10 mg via INTRAVENOUS

## 2021-01-30 MED ORDER — 0.9 % SODIUM CHLORIDE (POUR BTL) OPTIME
TOPICAL | Status: DC | PRN
  Administered 2021-01-30: 1000 mL

## 2021-01-30 MED ORDER — LIDOCAINE 2% (20 MG/ML) 5 ML SYRINGE
INTRAMUSCULAR | Status: AC
  Filled 2021-01-30: qty 5

## 2021-01-30 MED ORDER — ALBUTEROL SULFATE HFA 108 (90 BASE) MCG/ACT IN AERS: 2.0000 | INHALATION_SPRAY | Freq: Four times a day (QID) | RESPIRATORY_TRACT | Status: DC | PRN

## 2021-01-30 MED ORDER — PHENOL 1.4 % MT LIQD: 1.0000 | OROMUCOSAL | Status: DC | PRN

## 2021-01-30 MED ORDER — ACETAMINOPHEN 650 MG RE SUPP: 650.0000 mg | RECTAL | Status: DC | PRN

## 2021-01-30 MED ORDER — BUPIVACAINE-EPINEPHRINE 0.5% -1:200000 IJ SOLN
INTRAMUSCULAR | Status: DC | PRN
  Administered 2021-01-30: 3 mL

## 2021-01-30 MED ORDER — BUPIVACAINE-EPINEPHRINE 0.5% -1:200000 IJ SOLN
INTRAMUSCULAR | Status: AC
  Filled 2021-01-30: qty 1

## 2021-01-30 MED ORDER — CEFAZOLIN SODIUM-DEXTROSE 2-4 GM/100ML-% IV SOLN: 2.0000 g | Freq: Three times a day (TID) | INTRAVENOUS | Status: DC

## 2021-01-30 MED ORDER — ONDANSETRON HCL 4 MG/2ML IJ SOLN: 4.0000 mg | Freq: Four times a day (QID) | INTRAMUSCULAR | Status: DC | PRN

## 2021-01-30 MED ORDER — PHENYLEPHRINE HCL (PRESSORS) 10 MG/ML IV SOLN
INTRAVENOUS | Status: DC | PRN
  Administered 2021-01-30 (×3): 40 ug via INTRAVENOUS
  Administered 2021-01-30: 200 ug via INTRAVENOUS
  Administered 2021-01-30: 40 ug via INTRAVENOUS

## 2021-01-30 MED ORDER — THROMBIN 20000 UNITS EX SOLR
CUTANEOUS | Status: DC | PRN
  Administered 2021-01-30: 20 mL via TOPICAL

## 2021-01-30 MED ORDER — RISAQUAD PO CAPS: 1.0000 | ORAL_CAPSULE | Freq: Every day | ORAL | Status: DC

## 2021-01-30 MED ORDER — MIDAZOLAM HCL 5 MG/5ML IJ SOLN
INTRAMUSCULAR | Status: DC | PRN
  Administered 2021-01-30: 2 mg via INTRAVENOUS

## 2021-01-30 MED ORDER — OXYCODONE HCL 5 MG PO TABS
10.0000 mg | ORAL_TABLET | ORAL | Status: DC | PRN
Start: 2021-01-30 — End: 2021-01-31

## 2021-01-30 MED ORDER — ORAL CARE MOUTH RINSE: 15.0000 mL | Freq: Once | OROMUCOSAL | Status: AC

## 2021-01-30 MED ORDER — MENTHOL 3 MG MT LOZG: 1.0000 | LOZENGE | OROMUCOSAL | Status: DC | PRN

## 2021-01-30 MED ORDER — ACETAMINOPHEN 325 MG PO TABS: 650.0000 mg | ORAL_TABLET | ORAL | Status: DC | PRN

## 2021-01-30 MED ORDER — ONDANSETRON HCL 4 MG/2ML IJ SOLN
INTRAMUSCULAR | Status: DC | PRN
  Administered 2021-01-30: 4 mg via INTRAVENOUS

## 2021-01-30 MED ORDER — OXYCODONE HCL 5 MG PO TABS: 5.0000 mg | ORAL_TABLET | ORAL | 0 refills | Status: AC | PRN

## 2021-01-30 MED ORDER — ROCURONIUM BROMIDE 10 MG/ML (PF) SYRINGE
PREFILLED_SYRINGE | INTRAVENOUS | Status: DC | PRN
  Administered 2021-01-30: 60 mg via INTRAVENOUS
  Administered 2021-01-30 (×2): 10 mg via INTRAVENOUS

## 2021-01-30 MED ORDER — ONDANSETRON HCL 4 MG PO TABS
4.0000 mg | ORAL_TABLET | Freq: Four times a day (QID) | ORAL | Status: DC | PRN
Start: 2021-01-30 — End: 2021-01-31

## 2021-01-30 MED ORDER — ROCURONIUM BROMIDE 10 MG/ML (PF) SYRINGE
PREFILLED_SYRINGE | INTRAVENOUS | Status: AC
  Filled 2021-01-30: qty 10

## 2021-01-30 MED ORDER — PHENYLEPHRINE HCL-NACL 20-0.9 MG/250ML-% IV SOLN
INTRAVENOUS | Status: DC | PRN
  Administered 2021-01-30: 30 ug/min via INTRAVENOUS

## 2021-01-30 MED ORDER — ONDANSETRON HCL 4 MG/2ML IJ SOLN
INTRAMUSCULAR | Status: AC
  Filled 2021-01-30: qty 2

## 2021-01-30 MED ORDER — DOCUSATE SODIUM 100 MG PO CAPS: 100.0000 mg | ORAL_CAPSULE | Freq: Two times a day (BID) | ORAL | Status: DC

## 2021-01-30 MED ORDER — BISACODYL 5 MG PO TBEC: 5.0000 mg | DELAYED_RELEASE_TABLET | Freq: Every day | ORAL | Status: DC | PRN

## 2021-01-30 SURGICAL SUPPLY — 56 items
BAG COUNTER SPONGE SURGICOUNT (BAG) ×2 IMPLANT
BAG DECANTER FOR FLEXI CONT (MISCELLANEOUS) ×3 IMPLANT
BAG SURGICOUNT SPONGE COUNTING (BAG) ×1
BAND RUBBER #18 3X1/16 STRL (MISCELLANEOUS) ×6 IMPLANT
BENZOIN TINCTURE PRP APPL 2/3 (GAUZE/BANDAGES/DRESSINGS) ×3 IMPLANT
BUR STRYKR EGG 5.0 (BURR) IMPLANT
CLEANER TIP ELECTROSURG 2X2 (MISCELLANEOUS) ×3 IMPLANT
CLOSURE WOUND 1/2 X4 (GAUZE/BANDAGES/DRESSINGS) ×1
CNTNR URN SCR LID CUP LEK RST (MISCELLANEOUS) ×1 IMPLANT
CONT SPEC 4OZ STRL OR WHT (MISCELLANEOUS) ×3
DRAPE LAPAROTOMY 100X72X124 (DRAPES) ×3 IMPLANT
DRAPE MICROSCOPE LEICA (MISCELLANEOUS) ×3 IMPLANT
DRAPE SHEET LG 3/4 BI-LAMINATE (DRAPES) ×3 IMPLANT
DRAPE SURG 17X11 SM STRL (DRAPES) ×3 IMPLANT
DRAPE UTILITY XL STRL (DRAPES) ×3 IMPLANT
DRSG AQUACEL AG ADV 3.5X 4 (GAUZE/BANDAGES/DRESSINGS) IMPLANT
DRSG AQUACEL AG ADV 3.5X 6 (GAUZE/BANDAGES/DRESSINGS) ×3 IMPLANT
DRSG TELFA 3X8 NADH (GAUZE/BANDAGES/DRESSINGS) IMPLANT
DURAPREP 26ML APPLICATOR (WOUND CARE) ×3 IMPLANT
DURASEAL SPINE SEALANT 3ML (MISCELLANEOUS) IMPLANT
ELECT BLADE 4.0 EZ CLEAN MEGAD (MISCELLANEOUS) ×3
ELECT REM PT RETURN 9FT ADLT (ELECTROSURGICAL) ×3
ELECTRODE BLDE 4.0 EZ CLN MEGD (MISCELLANEOUS) ×1 IMPLANT
ELECTRODE REM PT RTRN 9FT ADLT (ELECTROSURGICAL) ×1 IMPLANT
GLOVE SURG POLYISO LF SZ7.5 (GLOVE) ×6 IMPLANT
GLOVE SURG POLYISO LF SZ8 (GLOVE) ×6 IMPLANT
GLOVE SURG UNDER POLY LF SZ7 (GLOVE) ×3 IMPLANT
GLOVE SURG UNDER POLY LF SZ7.5 (GLOVE) ×6 IMPLANT
GOWN STRL REUS W/ TWL LRG LVL3 (GOWN DISPOSABLE) ×1 IMPLANT
GOWN STRL REUS W/ TWL XL LVL3 (GOWN DISPOSABLE) ×2 IMPLANT
GOWN STRL REUS W/TWL LRG LVL3 (GOWN DISPOSABLE) ×3
GOWN STRL REUS W/TWL XL LVL3 (GOWN DISPOSABLE) ×6
IV CATH 14GX2 1/4 (CATHETERS) ×3 IMPLANT
KIT BASIN OR (CUSTOM PROCEDURE TRAY) ×3 IMPLANT
NEEDLE 22X1 1/2 (OR ONLY) (NEEDLE) ×3 IMPLANT
NEEDLE SPNL 18GX3.5 QUINCKE PK (NEEDLE) ×6 IMPLANT
PACK LAMINECTOMY NEURO (CUSTOM PROCEDURE TRAY) ×3 IMPLANT
PATTIES SURGICAL .75X.75 (GAUZE/BANDAGES/DRESSINGS) ×3 IMPLANT
SPONGE SURGIFOAM ABS GEL 100 (HEMOSTASIS) ×3 IMPLANT
SPONGE T-LAP 4X18 ~~LOC~~+RFID (SPONGE) ×6 IMPLANT
STAPLER VISISTAT (STAPLE) IMPLANT
STRIP CLOSURE SKIN 1/2X4 (GAUZE/BANDAGES/DRESSINGS) ×2 IMPLANT
SUT NURALON 4 0 TR CR/8 (SUTURE) IMPLANT
SUT PROLENE 3 0 PS 2 (SUTURE) ×3 IMPLANT
SUT VIC AB 1 CT1 27 (SUTURE) ×6
SUT VIC AB 1 CT1 27XBRD ANBCTR (SUTURE) ×1 IMPLANT
SUT VIC AB 1 CT1 27XBRD ANTBC (SUTURE) ×1 IMPLANT
SUT VIC AB 1-0 CT2 27 (SUTURE) IMPLANT
SUT VIC AB 2-0 CT1 27 (SUTURE) ×6
SUT VIC AB 2-0 CT1 TAPERPNT 27 (SUTURE) ×2 IMPLANT
SUT VIC AB 2-0 CT2 27 (SUTURE) IMPLANT
SYR 3ML LL SCALE MARK (SYRINGE) ×3 IMPLANT
TOWEL GREEN STERILE (TOWEL DISPOSABLE) ×3 IMPLANT
TOWEL GREEN STERILE FF (TOWEL DISPOSABLE) ×3 IMPLANT
TRAY FOLEY MTR SLVR 16FR STAT (SET/KITS/TRAYS/PACK) ×3 IMPLANT
YANKAUER SUCT BULB TIP NO VENT (SUCTIONS) ×3 IMPLANT

## 2021-01-30 NOTE — Op Note (Signed)
NAME: Jacob Lucas, Jacob Lucas MEDICAL RECORD NO: [id number] ACCOUNT NO: [id number] DATE OF BIRTH: 05-04-1961 FACILITY: [facility] LOCATION: [facility] PHYSICIAN: Javier Docker, MD  Operative Report   DATE OF PROCEDURE: 01/30/2021  PREOPERATIVE DIAGNOSIS:  Spinal stenosis, recurrent, L4-L5.  POSTOPERATIVE DIAGNOSES:  Spinal stenosis, recurrent, L4-L5; synovial cyst L4-L5, right, and herniated disk, L4-L5, right.  PROCEDURE PERFORMED: 1.  Microlumbar decompression, L4-5 bilaterally with L4-L5 foraminotomies. 2.  Revision decompression. 3.  Excision of synovial cyst, L4-L5, right. 4.  Microdiskectomy, L4-L5, right.  ANESTHESIA:  General.  ASSISTANT:  Andrez Grime, PA  HISTORY:  A 60 year old with predominantly right lower extremity radicular pain, L5 nerve root distribution, neural tension signs, EHL weakness.  MRI indicating disk herniation and stenosis.  Symptoms on the left in the L5 nerve root distribution with  EHL weakness as well.  He is indicated for microlumbar decompression and diskectomy.  Risks and benefits discussed including bleeding, infection, damage to neurovascular structures, no change in symptoms, worsening symptoms, DVT, PE, anesthetic  complications, etc.  TECHNIQUE:  The patient in supine position.  After induction of adequate general anesthesia, 2 grams Kefzol, placed prone on the Wilson frame.  Foley to gravity.  All bony prominences were well padded.  Lumbar region was prepped and draped in the usual  sterile fashion.  Two 18-gauge spinal needles were utilized to localize the 4-5 interspace, confirmed with x-ray.  Incision was made from the spinous process of 4 to 5.  Subcutaneous tissue was dissected, electrocautery was utilized to achieve  hemostasis.  Dorsolumbar fascia divided in line with skin incision.  Paraspinous muscle elevated from the lamina 4-5.  McCulloch retractor was placed.  Operating microscope was draped, brought in the surgical field.  Confirmatory radiograph  obtained at  L4-5 spinous processes with Kochers on each.  Leksell rongeur was utilized to remove the portion of the spinous processes of 4 and 5.  Next, hemilaminotomy of caudad edge of 4 was performed bilaterally.  Straight curette utilized to detach ligamentum  flavum from the cephalad edge of 5.  We detached ligamentum flavum from the caudad edge of 4 and from the cephalad edge of 5, we detached ligamentum flavum. Performed bilateral hemilaminotomies of L5 cephalad surface and foraminotomies of L5 bilaterally.   On the right lateral recess, there was a mass consistent with a synovial cyst.  I meticulously developed the plane between the thecal sac and the synovial cyst.  There was one portion of the outer wall of the synovial cyst that was adherent to the  thecal sac.  We therefore left that and removed the contents of the synovial cyst and the remainder of the synovial cyst was sent to pathology.  We then decompressed the lateral recess to the medial border of the pedicle.  I identified the lateral aspect  of the L5 nerve root, gently retracted it medially.  There was a large epidural venous plexus extending up into the foramen of 4.  I mobilized that with Nicholos Johns.  I then continued cephalad.  I identified disk herniation.  I performed an annulotomy and  removed a small portion of the disk from disk space.  This was a disk osteophyte complex noted.  I performed foraminotomy of L4.  I used bone wax to the cancellous surfaces.  I obtained confirmatory radiograph with Woodson retractors in the foramen at 4  and 5.  There was a lateral recess stenosis noted along the 4-5 area on the left too, compressing the 5 root.  I decompressed  the lateral recess to the medial border of the pedicle, protecting the L5 nerve root throughout.  No disk herniation noted on  this side.   I cauterized epidural venous plexus on the right.  Copiously irrigated the wound and Woodson retractor passed freely out the foramen of 4  and 5 and above the pedicle of 4 and below 5.  There was good excursion of the 5 root medial to the  pedicle, approximately 1 cm without difficulty.  I placed thrombin-soaked Gelfoam, 2 x 2 mm pieces in the lateral recess bilaterally, two on each side.  This after irrigating the disk space with catheter lavage.  No additional fragments were retrieved.   Following this, I inspected and meticulously achieved hemostasis with bipolar cautery.  Bone wax on the cancellous surfaces.  I removed the retractor.  Paraspinous muscles irrigated and cauterized.  No active bleeding.  I repaired the dorsolumbar fascia  with 1 Vicryl interrupted figure-of-eight sutures.  Left the aperture approximately 0.5 cm caudad to allow for any drainage.  Subcutaneous with 2-0 and skin with Prolene.  Sterile dressing applied, placed supine on the hospital bed, extubated without  difficulty and transported to the recovery room in satisfactory condition.  The patient tolerated the procedure well.  No complications.  Assistant, Andrez Grime, [place].  BLOOD LOSS:  60 mL.  SPECIMEN:  To pathology, synovial cyst, L4-L5 right.   SHW D: 01/30/2021 1:15:32 pm T: 01/30/2021 9:51:00 pm  JOB: [id number]/ [id number]

## 2021-01-30 NOTE — Transfer of Care (Signed)
Immediate Anesthesia Transfer of Care Note  Patient: Jacob Lucas  Procedure(s) Performed: Microlumbar decompression, discectomy Lumbar four-five  Patient Location: PACU  Anesthesia Type:General  Level of Consciousness: awake, oriented and patient cooperative  Airway & Oxygen Therapy: Patient connected to face mask oxygen  Post-op Assessment: Report given to RN, Post -op Vital signs reviewed and stable, Patient moving all extremities X 4 and Patient able to stick tongue midline  Post vital signs: stable  Last Vitals:  Vitals Value Taken Time  BP 118/77 01/30/21 1315  Temp    Pulse 88 01/30/21 1318  Resp 30 01/30/21 1318  SpO2 100 % 01/30/21 1318  Vitals shown include unvalidated device data.  Last Pain:  Vitals:   01/30/21 0852  TempSrc:   PainSc: 10-Worst pain ever         Complications: No notable events documented.

## 2021-01-30 NOTE — Anesthesia Preprocedure Evaluation (Addendum)
Anesthesia Evaluation  Patient identified by MRN, date of birth, ID band Patient awake    Reviewed: Allergy & Precautions, NPO status , Patient's Chart, lab work & pertinent test results  Airway Mallampati: III  TM Distance: >3 FB Neck ROM: Full    Dental  (+) Dental Advisory Given, Upper Dentures   Pulmonary asthma , Current Smoker and Patient abstained from smoking.,    Pulmonary exam normal breath sounds clear to auscultation       Cardiovascular negative cardio ROS Normal cardiovascular exam Rhythm:Regular Rate:Normal     Neuro/Psych Herniated disc and stenosis L4-5 negative psych ROS   GI/Hepatic Neg liver ROS, GERD  Medicated and Controlled,  Endo/Other  diabetes, Type 2, Oral Hypoglycemic Agents  Renal/GU negative Renal ROS     Musculoskeletal  (+) Arthritis ,   Abdominal   Peds  Hematology negative hematology ROS (+)   Anesthesia Other Findings Day of surgery medications reviewed with the patient.  Reproductive/Obstetrics                             Anesthesia Physical Anesthesia Plan  ASA: 2  Anesthesia Plan: General   Post-op Pain Management:    Induction: Intravenous  PONV Risk Score and Plan: 2 and Midazolam, Dexamethasone and Ondansetron  Airway Management Planned: Oral ETT  Additional Equipment:   Intra-op Plan:   Post-operative Plan: Extubation in OR  Informed Consent: I have reviewed the patients History and Physical, chart, labs and discussed the procedure including the risks, benefits and alternatives for the proposed anesthesia with the patient or authorized representative who has indicated his/her understanding and acceptance.     Dental advisory given  Plan Discussed with: CRNA  Anesthesia Plan Comments:         Anesthesia Quick Evaluation

## 2021-01-30 NOTE — Discharge Instructions (Addendum)

## 2021-01-30 NOTE — Interval H&P Note (Signed)
History and Physical Interval Note:  01/30/2021 10:09 AM  Scharlene Gloss  has presented today for surgery, with the diagnosis of Herniated disc and stenosis L4-5.  The various methods of treatment have been discussed with the patient and family. After consideration of risks, benefits and other options for treatment, the patient has consented to  Procedure(s) with comments: Microlumbar decompression, discectomy L4-5 (N/A) - 2 hrs as a surgical intervention.  The patient's history has been reviewed, patient examined, no change in status, stable for surgery.  I have reviewed the patient's chart and labs.  Questions were answered to the patient's satisfaction.     Javier Docker

## 2021-01-30 NOTE — Anesthesia Procedure Notes (Addendum)
Procedure Name: Intubation Date/Time: 01/30/2021 10:54 AM Performed by: Cy Blamer, CRNA Pre-anesthesia Checklist: Patient identified, Emergency Drugs available, Suction available and Patient being monitored Patient Re-evaluated:Patient Re-evaluated prior to induction Oxygen Delivery Method: Circle system utilized Preoxygenation: Pre-oxygenation with 100% oxygen Induction Type: IV induction Ventilation: Mask ventilation without difficulty Laryngoscope Size: Miller and 2 Grade View: Grade I Tube type: Oral Number of attempts: 1 Airway Equipment and Method: Stylet and Oral airway Placement Confirmation: ETT inserted through vocal cords under direct vision, positive ETCO2 and breath sounds checked- equal and bilateral Secured at: 22 cm Tube secured with: Tape Dental Injury: Teeth and Oropharynx as per pre-operative assessment  Comments: With ease.

## 2021-01-30 NOTE — Evaluation (Signed)
Physical Therapy Evaluation and Discharge Patient Details Name: Jacob Lucas MRN: [id number] DOB: 1960-12-07 Today's Date: 01/30/2021   History of Present Illness  60 y.o. male presented 9/1 for Microlumbar decompression, discectomy L4-5 with Right lower extremity radicular pain secondary to disc herniation and spinal stenosis at L4-5 PMH-back surgery  Clinical Impression   Patient evaluated by Physical Therapy with no further acute PT needs identified. All education has been completed and the patient has no further questions. PT is signing off. Thank you for this referral.     Follow Up Recommendations No PT follow up    Equipment Recommendations  None recommended by PT    Recommendations for Other Services       Precautions / Restrictions Precautions Precautions: Back Precaution Booklet Issued: Yes (comment) Required Braces or Orthoses:  (no brace)      Mobility  Bed Mobility Overal bed mobility: Needs Assistance Bed Mobility: Rolling;Sidelying to Sit Rolling: Independent Sidelying to sit: Supervision       General bed mobility comments: vc for technique; no physical assist needed    Transfers Overall transfer level: Needs assistance Equipment used: None Transfers: Sit to/from Stand Sit to Stand: Supervision         General transfer comment: no assist or cues; supervision for safety  Ambulation/Gait Ambulation/Gait assistance: Supervision Gait Distance (Feet): 200 Feet Assistive device: None Gait Pattern/deviations: WFL(Within Functional Limits)     General Gait Details: no imbalance  Stairs Stairs:  (deferred as no issue anticipated based on ambulation)          Wheelchair Mobility    Modified Rankin (Stroke Patients Only)       Balance                                             Pertinent Vitals/Pain Pain Assessment: 0-10 Pain Score: 5  Pain Location: back Pain Descriptors / Indicators: Operative site  guarding Pain Intervention(s): Limited activity within patient's tolerance    Home Living Family/patient expects to be discharged to:: Private residence Living Arrangements: Spouse/significant other Available Help at Discharge: Family Type of Home: House Home Access: Stairs to enter Entrance Stairs-Rails: Can reach both Entrance Stairs-Number of Steps: 2 Home Layout: One level Home Equipment: Shower seat - built in;Hand held shower head;Grab bars - tub/shower      Prior Function Level of Independence: Independent               Hand Dominance        Extremity/Trunk Assessment   Upper Extremity Assessment Upper Extremity Assessment: Defer to OT evaluation    Lower Extremity Assessment Lower Extremity Assessment: Overall WFL for tasks assessed (pt reports RLE pain has resolved)    Cervical / Trunk Assessment Cervical / Trunk Assessment: Normal  Communication   Communication: No difficulties  Cognition Arousal/Alertness: Awake/alert Behavior During Therapy: WFL for tasks assessed/performed Overall Cognitive Status: Within Functional Limits for tasks assessed                                        General Comments      Exercises     Assessment/Plan    PT Assessment Patent does not need any further PT services  PT Problem List  PT Treatment Interventions      PT Goals (Current goals can be found in the Care Plan section)  Acute Rehab PT Goals Patient Stated Goal: go home today PT Goal Formulation: All assessment and education complete, DC therapy    Frequency     Barriers to discharge        Co-evaluation               AM-PAC PT "6 Clicks" Mobility  Outcome Measure Help needed turning from your back to your side while in a flat bed without using bedrails?: None Help needed moving from lying on your back to sitting on the side of a flat bed without using bedrails?: A Little Help needed moving to and from a bed to a  chair (including a wheelchair)?: A Little Help needed standing up from a chair using your arms (e.g., wheelchair or bedside chair)?: A Little Help needed to walk in hospital room?: A Little Help needed climbing 3-5 steps with a railing? : A Little 6 Click Score: 19    End of Session   Activity Tolerance: Patient tolerated treatment well Patient left: in bed;with call bell/phone within reach (sitting EOB) Nurse Communication: Mobility status PT Visit Diagnosis: Pain Pain - Right/Left:  (center) Pain - part of body:  (back)    Time: 3244-0102 PT Time Calculation (min) (ACUTE ONLY): 10 min   Charges:   PT Evaluation $PT Eval Low Complexity: 1 Low           Jerolyn Center, PT Pager [phone]   Zena Amos 01/30/2021, 4:17 PM

## 2021-01-30 NOTE — Brief Op Note (Signed)
01/30/2021  1:08 PM  PATIENT:  Jacob Lucas  60 y.o. male  PRE-OPERATIVE DIAGNOSIS:  Herniated disc and stenosis Lumbar four-five  POST-OPERATIVE DIAGNOSIS:  Herniated disc and stenosis Lumbar four-five  PROCEDURE:  Procedure(s): Microlumbar decompression, discectomy Lumbar four-five (N/A)  SURGEON:  Surgeon(s) and Role:    Jene Every, MD - Primary  PHYSICIAN ASSISTANT:   ASSISTANTS: Bissell   ANESTHESIA:   general  EBL:  60 mL   BLOOD ADMINISTERED:none  DRAINS: none   LOCAL MEDICATIONS USED:  MARCAINE     SPECIMEN:  No Specimen  DISPOSITION OF SPECIMEN:  N/A  COUNTS:  YES  TOURNIQUET:  * No tourniquets in log *  DICTATION: .Other Dictation: Dictation Number [id number]  PLAN OF CARE: Admit for overnight observation  PATIENT DISPOSITION:  PACU - hemodynamically stable.   Delay start of Pharmacological VTE agent (>24hrs) due to surgical blood loss or risk of bleeding: yes

## 2021-01-30 NOTE — Evaluation (Signed)
Occupational Therapy Evaluation Patient Details Name: Jacob Lucas MRN: [id number] DOB: 1960-09-06 Today's Date: 01/30/2021    History of Present Illness 60 y.o. male presented 9/1 for Microlumbar decompression, discectomy L4-5 with Right lower extremity radicular pain secondary to disc herniation and spinal stenosis at L4-5 PMH-back surgery   Clinical Impression   Patient admitted for the procedure above.  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  Minimal complaint of post surgical pain.  Currently he is at baseline for all in room mobility without a device, and complete his ADL without assist from sit/stand level.  Recommend home with intermittent assist/supervision as needed.  No further needs in the acute setting.  All questions answered, and precautions reviewed.      Follow Up Recommendations  No OT follow up    Equipment Recommendations  None recommended by OT    Recommendations for Other Services       Precautions / Restrictions Precautions Precautions: Back Precaution Booklet Issued: Yes (comment) Precaution Comments: Reviewed Required Braces or Orthoses:  (no brace) Restrictions Weight Bearing Restrictions: No      Mobility Bed Mobility Overal bed mobility: Needs Assistance Bed Mobility: Rolling;Sidelying to Sit Rolling: Independent Sidelying to sit: Supervision       General bed mobility comments: sitting EOB upon arrival Patient Response: Cooperative  Transfers Overall transfer level: Independent Equipment used: None Transfers: Sit to/from Stand Sit to Stand: Supervision         General transfer comment: no assist or cues; supervision for safety    Balance Overall balance assessment: No apparent balance deficits (not formally assessed)                                         ADL either performed or assessed with clinical judgement   ADL Overall  ADL's : At baseline                                       General ADL Comments: no difficulties noted with ADL from sit/stand level     Vision Baseline Vision/History: 1 Wears glasses Patient Visual Report: No change from baseline       Perception     Praxis      Pertinent Vitals/Pain Pain Assessment: Faces Pain Score: 5  Faces Pain Scale: Hurts a little bit Pain Location: back Pain Descriptors / Indicators: Operative site guarding Pain Intervention(s): Monitored during session     Hand Dominance Right   Extremity/Trunk Assessment Upper Extremity Assessment Upper Extremity Assessment: Overall WFL for tasks assessed   Lower Extremity Assessment Lower Extremity Assessment: Defer to PT evaluation   Cervical / Trunk Assessment Cervical / Trunk Assessment: Other exceptions Cervical / Trunk Exceptions: spine surgery   Communication Communication Communication: No difficulties   Cognition Arousal/Alertness: Awake/alert Behavior During Therapy: WFL for tasks assessed/performed Overall Cognitive Status: Within Functional Limits for tasks assessed                                                      Home Living Family/patient expects to  be discharged to:: Private residence Living Arrangements: Spouse/significant other Available Help at Discharge: Family Type of Home: House Home Access: Stairs to enter Secretary/administrator of Steps: 2 Entrance Stairs-Rails: Can reach both Home Layout: One level     Bathroom Shower/Tub: Producer, television/film/video: Handicapped height     Home Equipment: Information systems manager - built in;Hand held shower head;Grab bars - tub/shower          Prior Functioning/Environment Level of Independence: Independent        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 List: Impaired balance (sitting and/or standing)      OT Treatment/Interventions:      OT Goals(Current  goals can be found in the care plan section) Acute Rehab OT Goals Patient Stated Goal: go home today OT Goal Formulation: With patient Time For Goal Achievement: 01/30/21 Potential to Achieve Goals: Good  OT Frequency:     Barriers to D/C:  None noted          Co-evaluation              AM-PAC OT "6 Clicks" Daily Activity     Outcome Measure Help from another person eating meals?: None Help from another person taking care of personal grooming?: None Help from another person toileting, which includes using toliet, bedpan, or urinal?: None Help from another person bathing (including washing, rinsing, drying)?: None Help from another person to put on and taking off regular upper body clothing?: None Help from another person to put on and taking off regular lower body clothing?: None 6 Click Score: 24   End of Session    Activity Tolerance: Patient tolerated treatment well Patient left: in bed;with call bell/phone within reach  OT Visit Diagnosis: Unsteadiness on feet (R26.81)                Time: 6789-3810 OT Time Calculation (min): 16 min Charges:  OT General Charges $OT Visit: 1 Visit OT Evaluation $OT Eval Moderate Complexity: 1 Mod  01/30/2021  RP, OTR/L  Acute Rehabilitation Services  Office:  [phone]   Suzanna Obey 01/30/2021, 4:58 PM

## 2021-01-30 NOTE — Progress Notes (Signed)
Patient was transported via wheelchair by NT for discharge home; in no acute distress nor complaints of pain nor discomfort; incision on his back clean, dry and intact; room was checked and accounted for all his belongings; discharge instructions given to patient by RN and he verbalized understanding on the instructions given.

## 2021-01-30 NOTE — Anesthesia Postprocedure Evaluation (Signed)
Anesthesia Post Note  Patient: Jacob Lucas  Procedure(s) Performed: Microlumbar decompression, discectomy Lumbar four-five     Patient location during evaluation: PACU Anesthesia Type: General Level of consciousness: awake and alert Pain management: pain level controlled Vital Signs Assessment: post-procedure vital signs reviewed and stable Respiratory status: spontaneous breathing, nonlabored ventilation, respiratory function stable and patient connected to nasal cannula oxygen Cardiovascular status: blood pressure returned to baseline and stable Postop Assessment: no apparent nausea or vomiting Anesthetic complications: no   No notable events documented.  Last Vitals:  Vitals:   01/30/21 1315 01/30/21 1400  BP:  121/83  Pulse:  86  Resp: (!) 21 18  Temp: 36.5 C (!) 36.4 C  SpO2:  99%    Last Pain:  Vitals:   01/30/21 1400  TempSrc: Oral  PainSc: 0-No pain                 Cecile Hearing

## 2021-01-31 ENCOUNTER — Encounter (HOSPITAL_COMMUNITY): Payer: Self-pay | Admitting: Specialist

## 2021-01-31 LAB — SURGICAL PATHOLOGY

## 2022-03-17 DIAGNOSIS — Z1211 Encounter for screening for malignant neoplasm of colon: Secondary | ICD-10-CM | POA: Diagnosis not present

## 2022-03-17 DIAGNOSIS — M545 Low back pain, unspecified: Secondary | ICD-10-CM | POA: Diagnosis not present

## 2022-03-17 DIAGNOSIS — E1169 Type 2 diabetes mellitus with other specified complication: Secondary | ICD-10-CM | POA: Diagnosis not present

## 2022-03-17 DIAGNOSIS — E785 Hyperlipidemia, unspecified: Secondary | ICD-10-CM | POA: Diagnosis not present

## 2022-03-17 DIAGNOSIS — G8929 Other chronic pain: Secondary | ICD-10-CM | POA: Diagnosis not present

## 2022-03-17 DIAGNOSIS — Z0000 Encounter for general adult medical examination without abnormal findings: Secondary | ICD-10-CM | POA: Diagnosis not present

## 2022-04-10 IMAGING — CR DG LUMBAR SPINE 2-3V
3 series · 3 of 3 positions shown · non-contrast
Comparison: 01/28/2021

CLINICAL DATA: L4-5 decompression

EXAM:
LUMBAR SPINE - 2-3 VIEW

[lateral (1 of 3)]
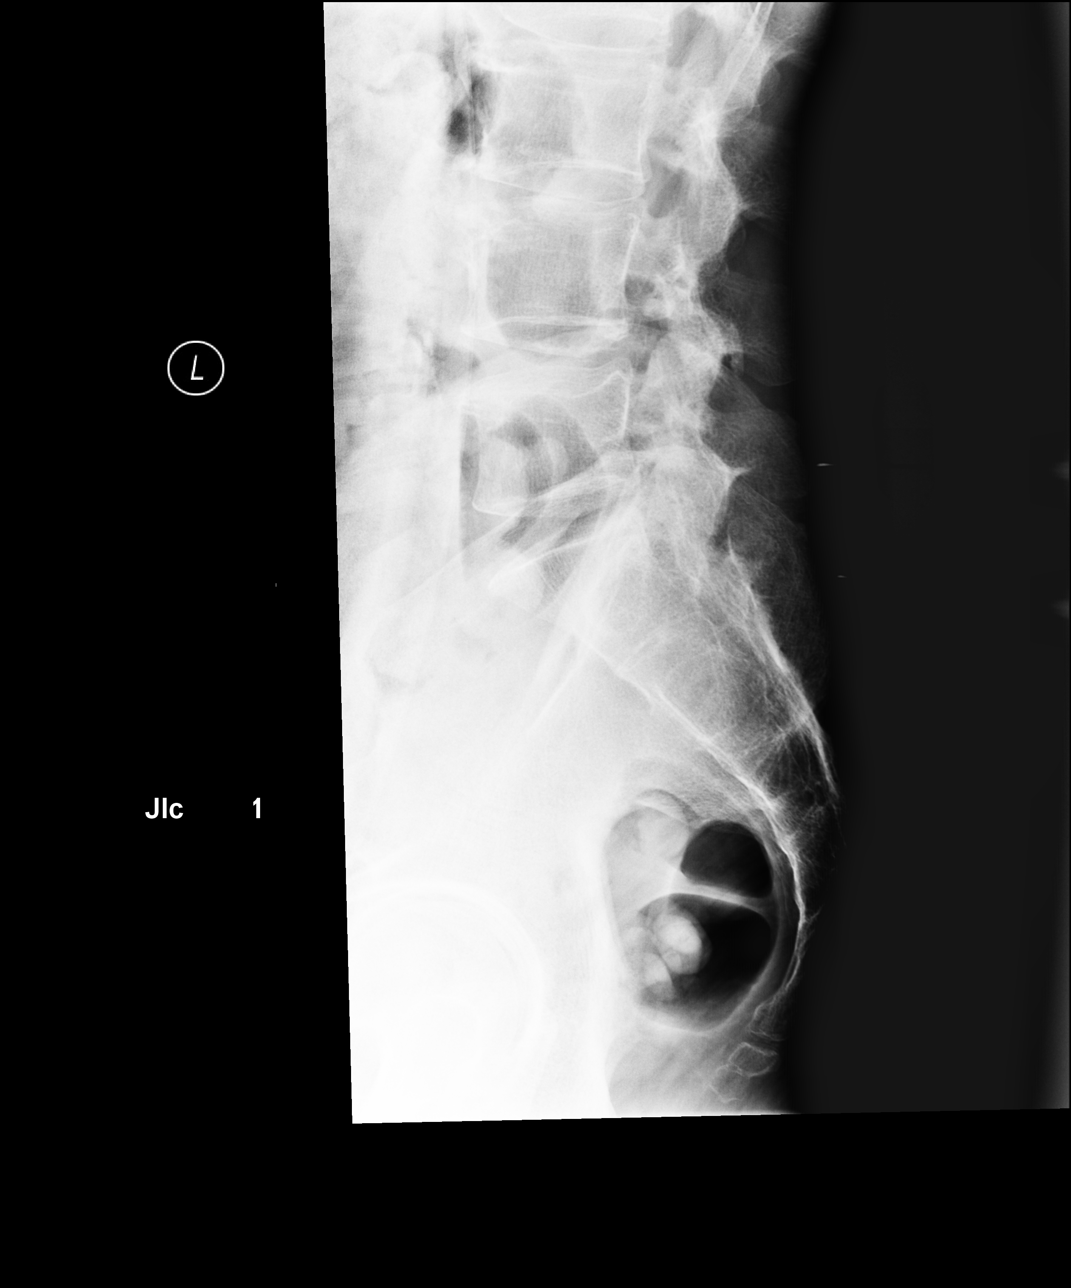

[lateral (2 of 3)]
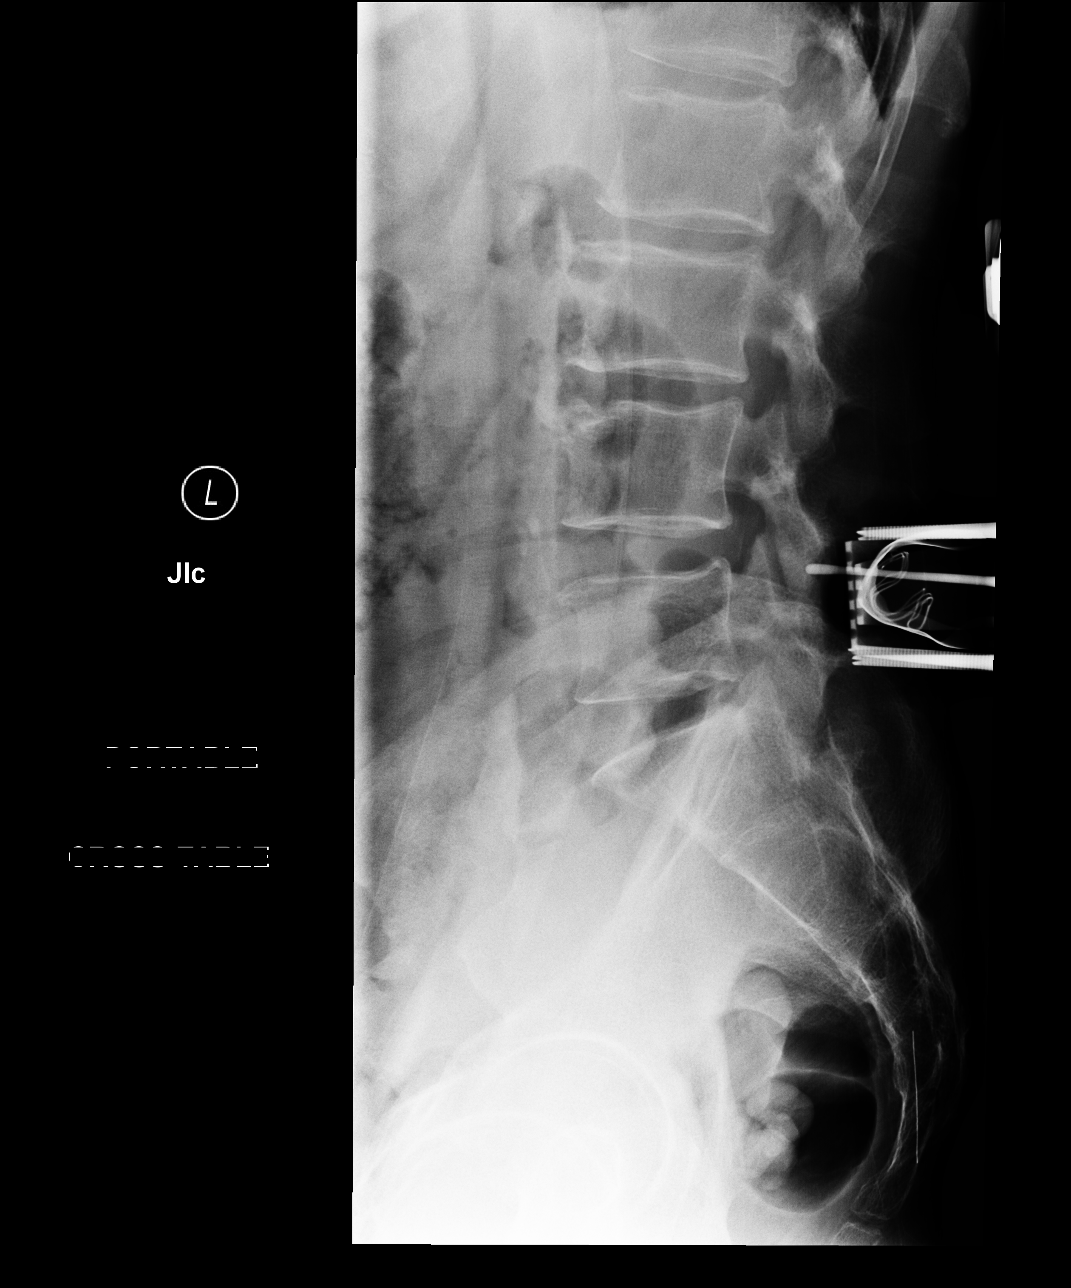

[lateral (3 of 3)]
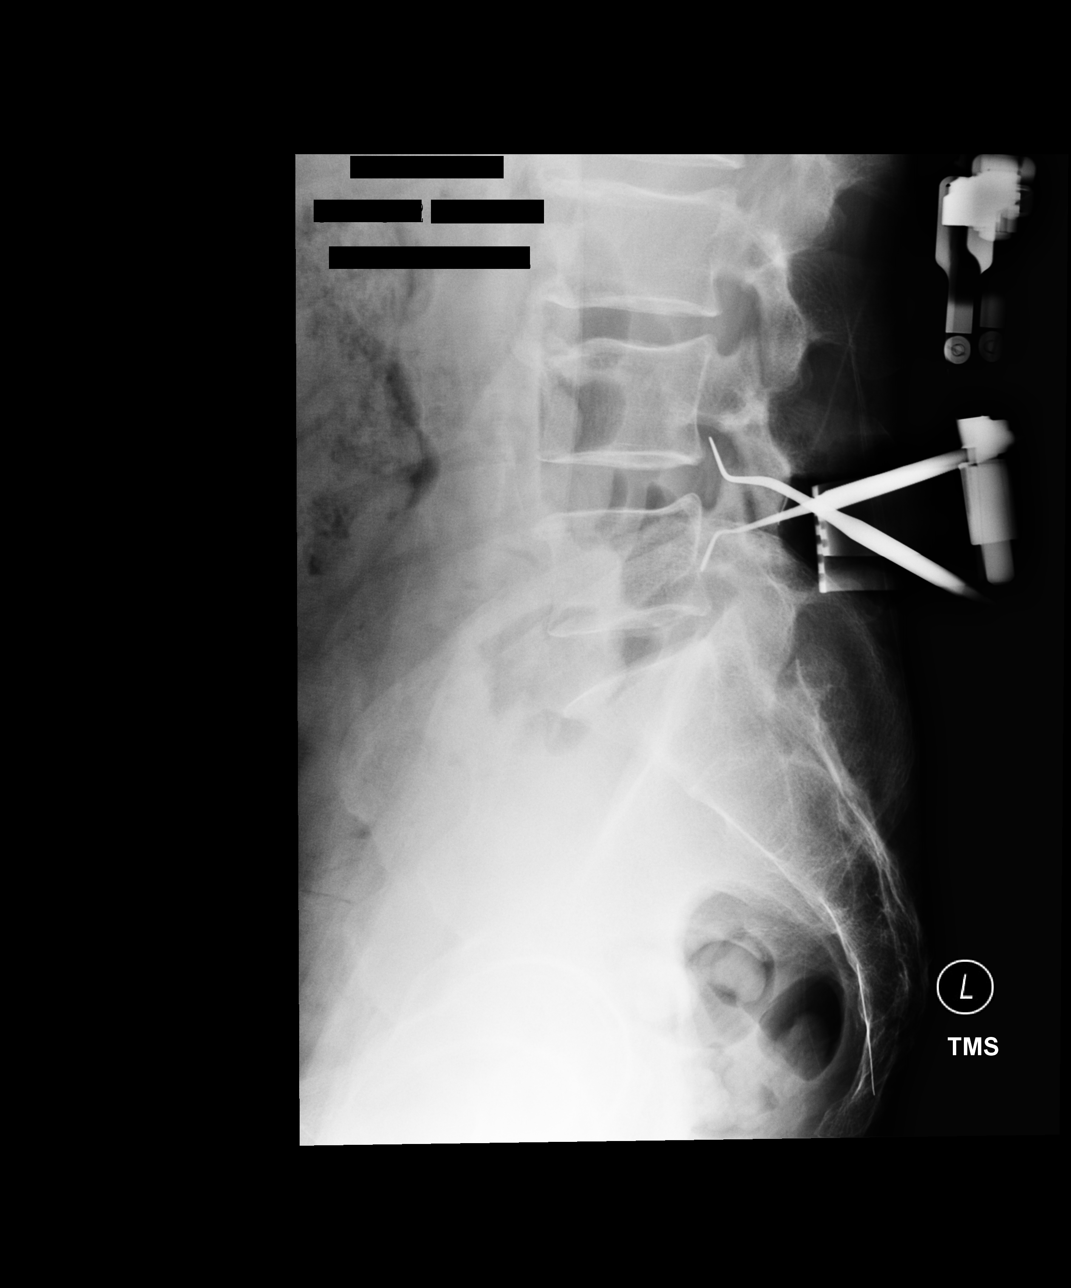

[3 of 3 positions shown; findings below may reference images not displayed]

FINDINGS: There are 3 cross-table lateral intraoperative radiographs of the
lumbar spine. Image 1 demonstrates 2 surgical probes projecting
within the soft tissues posterior to the L5 and S1 levels. Image 2
demonstrates surgical probe posterior to the L4-5 intervertebral
disc level. Image 3 demonstrates surgical instrumentation at the
L4-5 intervertebral disc level.
IMPRESSION: Intraoperative lumbar spine localization, as above.

## 2022-05-21 DIAGNOSIS — M4716 Other spondylosis with myelopathy, lumbar region: Secondary | ICD-10-CM | POA: Diagnosis not present

## 2022-05-21 DIAGNOSIS — Z01818 Encounter for other preprocedural examination: Secondary | ICD-10-CM | POA: Diagnosis not present

## 2022-05-21 DIAGNOSIS — R69 Illness, unspecified: Secondary | ICD-10-CM | POA: Diagnosis not present

## 2022-05-21 DIAGNOSIS — M961 Postlaminectomy syndrome, not elsewhere classified: Secondary | ICD-10-CM | POA: Diagnosis not present

## 2022-05-21 DIAGNOSIS — M48062 Spinal stenosis, lumbar region with neurogenic claudication: Secondary | ICD-10-CM | POA: Diagnosis not present

## 2022-05-21 DIAGNOSIS — M532X6 Spinal instabilities, lumbar region: Secondary | ICD-10-CM | POA: Diagnosis not present

## 2022-05-21 DIAGNOSIS — E785 Hyperlipidemia, unspecified: Secondary | ICD-10-CM | POA: Diagnosis not present

## 2022-05-21 DIAGNOSIS — E1169 Type 2 diabetes mellitus with other specified complication: Secondary | ICD-10-CM | POA: Diagnosis not present
